# Patient Record
Sex: Female | Born: 1935 | Race: White | Hispanic: No | Smoking: Current every day smoker
Health system: Southern US, Community
[De-identification: ages and names within clinical notes are randomized; demographics above are authoritative.]

## PROBLEM LIST (undated history)

## (undated) DIAGNOSIS — J449 Chronic obstructive pulmonary disease, unspecified: Secondary | ICD-10-CM

## (undated) DIAGNOSIS — G629 Polyneuropathy, unspecified: Secondary | ICD-10-CM

## (undated) DIAGNOSIS — I10 Essential (primary) hypertension: Secondary | ICD-10-CM

## (undated) HISTORY — PX: OTHER SURGICAL HISTORY: SHX169

## (undated) HISTORY — PX: BACK SURGERY: SHX140

## (undated) HISTORY — PX: ABDOMINAL HYSTERECTOMY: SHX81

---

## 2014-08-04 ENCOUNTER — Emergency Department (HOSPITAL_COMMUNITY)
Admission: EM | Admit: 2014-08-04 | Discharge: 2014-08-04 | Disposition: A | Discharge status: Home / Self Care | Payer: Medicare (Managed Care) | Attending: Emergency Medicine | Admitting: Emergency Medicine

## 2014-08-04 ENCOUNTER — Emergency Department (HOSPITAL_COMMUNITY): Payer: Medicare (Managed Care)

## 2014-08-04 ENCOUNTER — Encounter (HOSPITAL_COMMUNITY): Payer: Self-pay | Admitting: Emergency Medicine

## 2014-08-04 DIAGNOSIS — Z8669 Personal history of other diseases of the nervous system and sense organs: Secondary | ICD-10-CM | POA: Insufficient documentation

## 2014-08-04 DIAGNOSIS — J449 Chronic obstructive pulmonary disease, unspecified: Secondary | ICD-10-CM | POA: Diagnosis not present

## 2014-08-04 DIAGNOSIS — R32 Unspecified urinary incontinence: Secondary | ICD-10-CM | POA: Diagnosis not present

## 2014-08-04 DIAGNOSIS — I10 Essential (primary) hypertension: Secondary | ICD-10-CM | POA: Insufficient documentation

## 2014-08-04 DIAGNOSIS — M79609 Pain in unspecified limb: Secondary | ICD-10-CM | POA: Diagnosis present

## 2014-08-04 DIAGNOSIS — J4489 Other specified chronic obstructive pulmonary disease: Secondary | ICD-10-CM | POA: Insufficient documentation

## 2014-08-04 DIAGNOSIS — R209 Unspecified disturbances of skin sensation: Secondary | ICD-10-CM | POA: Diagnosis not present

## 2014-08-04 DIAGNOSIS — F17200 Nicotine dependence, unspecified, uncomplicated: Secondary | ICD-10-CM | POA: Insufficient documentation

## 2014-08-04 DIAGNOSIS — R6883 Chills (without fever): Secondary | ICD-10-CM | POA: Insufficient documentation

## 2014-08-04 DIAGNOSIS — R079 Chest pain, unspecified: Secondary | ICD-10-CM | POA: Diagnosis not present

## 2014-08-04 DIAGNOSIS — R51 Headache: Secondary | ICD-10-CM | POA: Diagnosis not present

## 2014-08-04 DIAGNOSIS — Z79899 Other long term (current) drug therapy: Secondary | ICD-10-CM | POA: Insufficient documentation

## 2014-08-04 HISTORY — DX: Essential (primary) hypertension: I10

## 2014-08-04 HISTORY — DX: Chronic obstructive pulmonary disease, unspecified: J44.9

## 2014-08-04 HISTORY — DX: Polyneuropathy, unspecified: G62.9

## 2014-08-04 LAB — BASIC METABOLIC PANEL
Anion gap: 12 (ref 5–15)
BUN: 15 mg/dL (ref 6–23)
CALCIUM: 9.7 mg/dL (ref 8.4–10.5)
CHLORIDE: 102 meq/L (ref 96–112)
CO2: 25 meq/L (ref 19–32)
CREATININE: 1.29 mg/dL — AB (ref 0.50–1.10)
GFR calc non Af Amer: 39 mL/min — ABNORMAL LOW (ref >90)
GFR, EST AFRICAN AMERICAN: 46 mL/min — AB (ref >90)
Glucose, Bld: 101 mg/dL — ABNORMAL HIGH (ref 70–99)
Potassium: 5.1 mEq/L (ref 3.7–5.3)
SODIUM: 139 meq/L (ref 137–147)

## 2014-08-04 LAB — CBC WITH DIFFERENTIAL/PLATELET
Basophils Absolute: 0.1 10*3/uL (ref 0.0–0.1)
Basophils Relative: 1 % (ref 0–1)
EOS PCT: 2 % (ref 0–5)
Eosinophils Absolute: 0.2 10*3/uL (ref 0.0–0.7)
HCT: 41.8 % (ref 36.0–46.0)
Hemoglobin: 13.6 g/dL (ref 12.0–15.0)
LYMPHS PCT: 28 % (ref 12–46)
Lymphs Abs: 2.7 10*3/uL (ref 0.7–4.0)
MCH: 28.2 pg (ref 26.0–34.0)
MCHC: 32.5 g/dL (ref 30.0–36.0)
MCV: 86.5 fL (ref 78.0–100.0)
MONO ABS: 0.9 10*3/uL (ref 0.1–1.0)
Monocytes Relative: 9 % (ref 3–12)
NEUTROS ABS: 5.7 10*3/uL (ref 1.7–7.7)
Neutrophils Relative %: 60 % (ref 43–77)
PLATELETS: 243 10*3/uL (ref 150–400)
RBC: 4.83 MIL/uL (ref 3.87–5.11)
RDW: 13.7 % (ref 11.5–15.5)
WBC: 9.5 10*3/uL (ref 4.0–10.5)

## 2014-08-04 LAB — TROPONIN I: Troponin I: <0.3 ng/mL (ref <0.30)

## 2014-08-04 MED ORDER — GABAPENTIN 800 MG PO TABS: 800.0000 mg | ORAL_TABLET | Freq: Three times a day (TID) | ORAL | Status: DC

## 2014-08-04 NOTE — Discharge Instructions (Signed)
Resource guide provided to help you find a regular doctor. Your Neurontin has been ordered take as directed. Return for any newer worse symptoms. Today's workup without any significant findings. Lab results provided.    Emergency Department Resource Guide 1) Find a Doctor and Pay Out of Pocket Although you won't have to find out who is covered by your insurance plan, it is a good idea to ask around and get recommendations. You will then need to call the office and see if the doctor you have chosen will accept you as a new patient and what types of options they offer for patients who are self-pay. Some doctors offer discounts or will set up payment plans for their patients who do not have insurance, but you will need to ask so you aren't surprised when you get to your appointment.  2) Contact Your Local Health Department Not all health departments have doctors that can see patients for sick visits, but many do, so it is worth a call to see if yours does. If you don't know where your local health department is, you can check in your phone book. The CDC also has a tool to help you locate your state's health department, and many state websites also have listings of all of their local health departments.  3) Find a Walk-in Clinic If your illness is not likely to be very severe or complicated, you may want to try a walk in clinic. These are popping up all over the country in pharmacies, drugstores, and shopping centers. They're usually staffed by nurse practitioners or physician assistants that have been trained to treat common illnesses and complaints. They're usually fairly quick and inexpensive. However, if you have serious medical issues or chronic medical problems, these are probably not your best option.  No Primary Care Doctor: - Call Health Connect at  [phone] - they can help you locate a primary care doctor that  accepts your insurance, provides certain services, etc. - Physician Referral  Service- [phone]  Chronic Pain Problems: Organization         Address  Phone   Notes  [facility]  [phone] Patients need to be referred by their primary care doctor.   Medication Assistance: Organization         Address  Phone   Notes  [facility] Medication [facility] [address] [place] [place], [place] [address] [phone] --Must be a resident of [facility] -- Must have NO insurance coverage whatsoever (no Medicaid/ Medicare, etc.) -- The pt. MUST have a primary care doctor that directs their care regularly and follows them in the community   [facility]  [phone]   [employer]  [phone]    Agencies that provide inexpensive medical care: Organization         Address  Phone   Notes  [facility]  [phone]   [facility]    [phone]   [facility] [address] [place], [place] [address] [phone]   [facility] [address] [place] [place] [phone]   Planned Parenthood    [phone]   [facility]    [phone]   [facility]  [address], [place] Phone:  [phone], Fax:  [phone] Hours of Operation:  9 am - 6 pm, M-F.  Also accepts Medicaid/Medicare and self-pay.  [facility]  [facility]  [address], [place] Phone: [phone], Fax: [phone]. Hours of Operation:  8:30 am - 5:30 pm, M-F.  Also accepts Medicaid and self-pay.  [facility] [place] [address] [place] Phone: [phone]   George Mason, [place], [place] [phone], Ext. [phone] Mondays & Thursdays: 7-9 AM.  First 15 patients are seen on a first come, first serve basis.    [place] Providers:  Organization         Address  Phone   Notes  [facility] [address], [place] [phone] Also accepts self-pay patients.  [facility] [address], [place]  [phone]   [place], [address], [place] [phone]   [facility] [address], [place] [phone]   Lucianne Lei [address], [place]   [phone] Only accepts [place] Access [place] patients after they have their name applied to their card.   Self-Pay (no insurance) in [facility]:  Organization         Address  Phone   Notes  Sickle Cell Patients, [facility] [place] [phone]   [facility] [place] [phone]   [facility] [place]  [place], [place],  [phone]   [facility]/Dr. Osei-Bonsu  [address], [place] or [address], [place] [phone] Phone number for both [place] and [place] locations is the same.  [facility]gent [facility] and [facility] [address], [place] [phone]   [facility] [address] [place] or [address] [phone]   [facility] [address], [place] [phone], phone; [phone], fax Sees patients 1st and 3rd Saturday of every month.  Must not qualify for public or private insurance (i.e. Medicaid, Medicare, [place] Health Choice, Veterans' Benefits)  Household income should be no more than 200% of the poverty level The clinic cannot treat you if you are pregnant or think you are pregnant  Sexually transmitted diseases are not treated at the clinic.    Dental Care: Organization         Address  Phone  Notes  [facility] Department of [facility] [phone] Accepts children up to age 4 who are enrolled in  [place] or [place]; pregnant women with a Medicaid card; and children who have applied for Medicaid or [place] Health Choice, but were declined, whose parents can pay a reduced fee at time of service.  [facility] of [facility]  [address], [place] [phone] Accepts children up to age 46 who are enrolled in [place] or [place]; pregnant women with a Medicaid card; and children who have applied for Medicaid or [place] Health Choice, but were declined, whose parents can pay a reduced fee at time of service.  [place] Adult [facility] Access PROGRAM  [place] [phone] Patients are seen by appointment only. Walk-ins are not accepted. [facility] will see patients 39 years of age and older. Monday - Tuesday (8am-5pm) Most Wednesdays (8:30-5pm) $30 per visit, cash only  [facility]  [address], [facility] [phone] Patients are seen by appointment only. Walk-ins are not accepted. [place] will see patients 55 years of age and older. One Wednesday Evening (Monthly: Volunteer Based).  $30 per visit, cash only  [place]  [phone] for adults; Children under age 16, call Graduate Pediatric Dentistry at [phone]. Children aged 15-14, please call [phone] to request a pediatric application.  Dental services are provided in all areas of dental care including fillings, crowns and bridges, complete and partial dentures, implants, gum treatment, root canals, and extractions. Preventive care is also provided. Treatment is provided to both adults and children. Patients are selected via a lottery and there is often a waiting list.   [facility] [address] [place]  [phone] [email or web address]   [facility] [address] [place], [place] [phone], Ext. [phone] Second and Fourth Thursday of each month, opens at 6:30  AM; Clinic ends at 9 AM.  Patients are seen on a first-come first-served basis, and a limited number are seen during each clinic.   [facility]  [address] Hillard Danker [place], [place] [phone]   Eligibility Requirements You must have lived in [place], [place], or [place] counties for at least the last three months.   You cannot be eligible for state or federal sponsored [employer], including [employer], [place], or [employer].   You generally cannot be eligible for healthcare insurance through your employer.    How to apply: Eligibility screenings are held every Tuesday and Wednesday afternoon from 1:00 pm until 4:00 pm. You do not need an appointment for the interview!  [facility] [address] [place], [place]   [employer]  [place] Department  [place]  [phone]    Behavioral Health Resources in the Community: Intensive Outpatient Programs Organization         Address  Phone  Notes  [place]. [address], [place], [place] [phone]   [facility] [address], [place], [employer]   [facility]: [facility] [address], [place], [place]   [place] [address]  [place], [place] or [phone]   Substance Abuse Resources Organization         Address  Phone  Notes  Alcohol and Drug Services  [phone]   [place]  [phone]   [facility] [place]   Chinita Pester  [phone]   Residential & Outpatient Substance Abuse Program  [phone]   Psychological Services Organization         Address  Phone  Notes  [facility] [place]  [place]  [phone]   [place] [address] [place] [phone] or  [phone]    Mobile Crisis Teams Organization         Address  Phone  Notes  [facility], Mobile Crisis Care Unit  [phone]   [facility]  [address] [place], [place]   [place] [address] [place] [phone]    Self-Help/Support Groups Organization         Address  Phone             Notes  [facility] of [place] - variety of support groups  [place] Call for more information  Narcotics Anonymous (NA), [facility] [address], [employer] [place]  2 meetings at this location   Special educational needs teacher         Address  Phone  Notes  [facility] [place],    [place]  [phone]   [facility]  [address], [place] [address], [place], [place]   [place], [place] [phone] Admissions: 8am-3pm M-F  Incentives Substance [place] [address],    [place], [place] [phone]   [facility] [address] [place], [place], [place]   [facility] [address],  [place], [place]   [facility] - Intensive Outpatient [address], [place], [place]   [facility] ([place].,  [place], [place] [phone] or [phone]   [facility] ([facility]) [address] [place], [place] Accepts Medicaid  [facility] [place] [address]  [place] [phone] Substance Abuse/Addiction Treatment   [facility] Organization         Address  Phone  Notes  [facility]  [phone]   Domenic Schwab, PhD [address] Arlis Porta [place], [place]   [phone] or [phone]   [place], [place] [phone]   [facility] [address],  [place], [place] [phone] Insurance/Medicaid/sponsorship through [facility] and Families [address], [address]                                    [place], [place] [phone] [place] [address], [place] [phone]    Dr. Adele Schilder  [phone]   [facility] Dept. 1) [address] [place] 2) [place] 3)  [place] [address], [place] [phone]  [phone]   [place] [phone] or [phone] (After Hours)

## 2014-08-04 NOTE — ED Notes (Addendum)
Ran out of medicine and is c/o neuropathy pain to bil legs.  Pt also c/o "constant cp", states has been seen by doctors in [facility] but never told what is was.

## 2014-08-04 NOTE — ED Provider Notes (Signed)
CSN: [id number]     Arrival date & time 08/04/14  1540 History  This chart was scribed for Vanetta Mulders, MD by Milly Jakob, ED Scribe. The patient was seen in room APA19/APA19. Patient's care was started at 8:11 PM.   Chief Complaint  Patient presents with  . Leg Pain   The history is provided by the patient. No language interpreter was used.   HPI Comments: Linda Mcclure is a 78 y.o. female who presents to the Emergency Department complaining of bilateral substernal, "squeezing," central chest pain every day for at least the past week. She rates this pain as an 8/10. She reports associated SOB that radiates into her back onset 3 weeks ago. Her relative reports that she recently had open heart surgery a few months ago when they were living in [place]. She also reports pain in her legs bilaterally that she describes as her legs feeling like "cold wax" at night. She reports a morphine patch attached to her back that has not been moved for 6 years. She reports a history of neuropathy in her legs for which she takes 800 MG Neurontin 3 times per day with some relief. She reports difficulty walking without assistance.   Past Medical History  Diagnosis Date  . Neuropathy   . Hypertension   . COPD (chronic obstructive pulmonary disease)    Past Surgical History  Procedure Laterality Date  . Back surgery    . Carpel tunnel    . Abdominal hysterectomy     No family history on file. History  Substance Use Topics  . Smoking status: Current Every Day Smoker    Types: Cigarettes  . Smokeless tobacco: Not on file  . Alcohol Use: No   OB History   Grav Para Term Preterm Abortions TAB SAB Ect Mult Living                 Review of Systems  Constitutional: Positive for chills. Negative for fever.  HENT: Negative for rhinorrhea and sore throat.   Eyes: Negative for visual disturbance.  Respiratory: Positive for cough and shortness of breath.   Cardiovascular: Positive for chest pain.  Negative for leg swelling.  Gastrointestinal: Negative for nausea, vomiting, abdominal pain and diarrhea.  Genitourinary: Negative for dysuria.       Some baseline urinary incontinence   Musculoskeletal: Positive for back pain. Negative for neck pain.  Skin: Negative for rash.  Neurological: Positive for numbness and headaches. Negative for dizziness and light-headedness.  Hematological: Does not bruise/bleed easily.  Psychiatric/Behavioral: Negative for confusion.    Allergies  Review of patient's allergies indicates no known allergies.  Home Medications   Prior to Admission medications   Medication Sig Start Date End Date Taking? Authorizing Provider  albuterol (PROVENTIL HFA;VENTOLIN HFA) 108 (90 BASE) MCG/ACT inhaler Inhale 2 puffs into the lungs every 6 (six) hours as needed for wheezing or shortness of breath.   Yes Historical Provider, MD  amLODipine (NORVASC) 5 MG tablet Take 5 mg by mouth daily.   Yes Historical Provider, MD  cholecalciferol (VITAMIN D) 1000 UNITS tablet Take 1,000 Units by mouth daily.   Yes Historical Provider, MD  ferrous sulfate 325 (65 FE) MG tablet Take 325 mg by mouth daily with breakfast.   Yes Historical Provider, MD  folic acid (FOLVITE) 1 MG tablet Take 1 mg by mouth daily.   Yes Historical Provider, MD  gabapentin (NEURONTIN) 800 MG tablet Take 800 mg by mouth 3 (three) times daily.  Yes Historical Provider, MD  HYDROcodone-acetaminophen (NORCO/VICODIN) 5-325 MG per tablet Take 1 tablet by mouth every 6 (six) hours as needed for moderate pain.   Yes Historical Provider, MD  losartan (COZAAR) 100 MG tablet Take 100 mg by mouth daily.   Yes Historical Provider, MD  OVER THE COUNTER MEDICATION Take 1 tablet by mouth daily. PROSIGHT Vitamin Mineral and Supplement   Yes Historical Provider, MD  simvastatin (ZOCOR) 40 MG tablet Take 40 mg by mouth every evening.   Yes Historical Provider, MD  vitamin B-12 (CYANOCOBALAMIN) 100 MCG tablet Take 100 mcg by  mouth daily.   Yes Historical Provider, MD  vitamin C (ASCORBIC ACID) 500 MG tablet Take 500 mg by mouth daily.   Yes Historical Provider, MD  gabapentin (NEURONTIN) 800 MG tablet Take 1 tablet (800 mg total) by mouth 3 (three) times daily. 08/04/14   Vanetta Mulders, MD   Triage Vitals: BP 148/62  Pulse 81  Temp(Src) 97.7 F (36.5 C) (Oral)  Resp 18  Ht  (1.6 m)  Wt 150 lb (68.04 kg)  BMI 26.58 kg/m2  SpO2 99% Physical Exam  Cardiovascular: Normal rate, regular rhythm and normal heart sounds.   No murmur heard. Pulmonary/Chest: Effort normal and breath sounds normal. No respiratory distress. She has no wheezes. She has no rales.  Abdominal: Soft. Bowel sounds are normal. There is no tenderness.  Musculoskeletal: She exhibits no edema.  Capillary refill < 2 seconds in both big toes.    ED Course  Procedures (including critical care time) DIAGNOSTIC STUDIES: Oxygen Saturation is 99% on room air, normal by my interpretation.    COORDINATION OF CARE: 8:23 PM-Discussed treatment plan which includes lab work and CXR with pt at bedside and pt agreed to plan.   Labs Review Labs Reviewed  BASIC METABOLIC PANEL - Abnormal; Notable for the following:    Glucose, Bld 101 (*)    Creatinine, Ser 1.29 (*)    GFR calc non Af Amer 39 (*)    GFR calc Af Amer 46 (*)    All other components within normal limits  CBC WITH DIFFERENTIAL  TROPONIN I    Imaging Review Dg Chest 2 View  08/04/2014   CLINICAL DATA:  Chronic chest pain and dizziness with history of hypertension and COPD  EXAM: CHEST  2 VIEW  COMPARISON:  None.  FINDINGS: The lungs are well-expanded and clear. The heart and mediastinal structures are within the limits of normal. There is mild tortuosity of the descending thoracic aorta. There is gentle curvature of the mid thoracic spine with convexity toward the left. There is no pleural effusion or pneumothorax. There is partial compression of the body of T12 with loss of height  anteriorly of approximately 20%.  IMPRESSION: There is no evidence of pneumonia nor CHF. Mild hyperinflation is consistent with COPD.   Electronically Signed   By: David  [place]   On: 08/04/2014 17:12     EKG Interpretation   Date/Time:  Wednesday August 04 2014 15:53:42 EDT Ventricular Rate:  80 PR Interval:  172 QRS Duration: 70 QT Interval:  404 QTC Calculation: 465 R Axis:   34 Text Interpretation:  Sinus rhythm with occasional Premature ventricular  complexes Otherwise normal ECG No previous ECGs available Confirmed by  Gerlene Glassburn  MD, Jamar Casagrande ([id number]) on 08/04/2014 7:51:29 PM      MDM   Final diagnoses:  Chest pain, unspecified chest pain type   Patient with a chronic chest pain been present for  several weeks. Workup here today EKG without acute changes. Chest x-ray negative for any acute changes. Patient's troponin was negative.  In addition patient has long-standing neuropathy of both legs. Almost out of her Neurontin that was renewed.   I personally performed the services described in this documentation, which was scribed in my presence. The recorded information has been reviewed and is accurate.     Vanetta Mulders, MD 08/05/14 [id number]

## 2014-12-11 ENCOUNTER — Encounter (HOSPITAL_COMMUNITY): Payer: Self-pay | Admitting: *Deleted

## 2014-12-11 ENCOUNTER — Emergency Department (HOSPITAL_COMMUNITY)
Admission: EM | Admit: 2014-12-11 | Discharge: 2014-12-11 | Disposition: A | Discharge status: Home / Self Care | Payer: Medicare (Managed Care) | Attending: Emergency Medicine | Admitting: Emergency Medicine

## 2014-12-11 DIAGNOSIS — Z720 Tobacco use: Secondary | ICD-10-CM | POA: Diagnosis not present

## 2014-12-11 DIAGNOSIS — I10 Essential (primary) hypertension: Secondary | ICD-10-CM | POA: Insufficient documentation

## 2014-12-11 DIAGNOSIS — G629 Polyneuropathy, unspecified: Secondary | ICD-10-CM | POA: Diagnosis not present

## 2014-12-11 DIAGNOSIS — R112 Nausea with vomiting, unspecified: Secondary | ICD-10-CM | POA: Insufficient documentation

## 2014-12-11 DIAGNOSIS — L03211 Cellulitis of face: Secondary | ICD-10-CM | POA: Diagnosis not present

## 2014-12-11 DIAGNOSIS — J449 Chronic obstructive pulmonary disease, unspecified: Secondary | ICD-10-CM | POA: Insufficient documentation

## 2014-12-11 DIAGNOSIS — L309 Dermatitis, unspecified: Secondary | ICD-10-CM | POA: Insufficient documentation

## 2014-12-11 DIAGNOSIS — Z79899 Other long term (current) drug therapy: Secondary | ICD-10-CM | POA: Diagnosis not present

## 2014-12-11 DIAGNOSIS — R21 Rash and other nonspecific skin eruption: Secondary | ICD-10-CM | POA: Diagnosis present

## 2014-12-11 MED ORDER — PREDNISONE 20 MG PO TABS: ORAL_TABLET | ORAL | Status: AC

## 2014-12-11 MED ORDER — HYDROXYZINE HCL 25 MG PO TABS: 25.0000 mg | ORAL_TABLET | Freq: Four times a day (QID) | ORAL | Status: AC

## 2014-12-11 MED ORDER — SULFAMETHOXAZOLE-TRIMETHOPRIM 800-160 MG PO TABS
1.0000 | ORAL_TABLET | Freq: Two times a day (BID) | ORAL | Status: AC
Start: 2014-12-11 — End: ?

## 2014-12-11 MED ORDER — GABAPENTIN 800 MG PO TABS: 800.0000 mg | ORAL_TABLET | Freq: Three times a day (TID) | ORAL | Status: AC

## 2014-12-11 NOTE — ED Notes (Signed)
Pt brought in by [facility] for c/o spider bites to right cheek and nape of neck; clear drainage is coming from right cheek and red raised area to nape of neck

## 2014-12-11 NOTE — ED Provider Notes (Addendum)
CSN: [id number]     Arrival date & time 12/11/14  1913 History   First MD Initiated Contact with Patient 12/11/14 1916     Chief Complaint  Patient presents with  . Insect Bite     (Consider location/radiation/quality/duration/timing/severity/associated sxs/prior Treatment) HPI Comments: Patient presents to the ER by EMS because she thinks she was bitten by a brown recluse spider. Patient reports that there is a painful raised red spot on the back of her neck and also on her right cheek. Area is mildly to moderately painful, constant. No exacerbating or alleviating factors. She reports a low-grade fever earlier and had nausea and vomiting.   Past Medical History  Diagnosis Date  . Neuropathy   . Hypertension   . COPD (chronic obstructive pulmonary disease)    Past Surgical History  Procedure Laterality Date  . Back surgery    . Carpel tunnel    . Abdominal hysterectomy     History reviewed. No pertinent family history. History  Substance Use Topics  . Smoking status: Current Every Day Smoker    Types: Cigarettes  . Smokeless tobacco: Not on file  . Alcohol Use: No   OB History    No data available     Review of Systems  Skin: Positive for rash.  All other systems reviewed and are negative.     Allergies  Review of patient's allergies indicates no known allergies.  Home Medications   Prior to Admission medications   Medication Sig Start Date End Date Taking? Authorizing Provider  albuterol (PROVENTIL HFA;VENTOLIN HFA) 108 (90 BASE) MCG/ACT inhaler Inhale 2 puffs into the lungs every 6 (six) hours as needed for wheezing or shortness of breath.    Historical Provider, MD  amLODipine (NORVASC) 5 MG tablet Take 5 mg by mouth daily.    Historical Provider, MD  cholecalciferol (VITAMIN D) 1000 UNITS tablet Take 1,000 Units by mouth daily.    Historical Provider, MD  ferrous sulfate 325 (65 FE) MG tablet Take 325 mg by mouth daily with breakfast.    Historical Provider,  MD  folic acid (FOLVITE) 1 MG tablet Take 1 mg by mouth daily.    Historical Provider, MD  gabapentin (NEURONTIN) 800 MG tablet Take 800 mg by mouth 3 (three) times daily.    Historical Provider, MD  gabapentin (NEURONTIN) 800 MG tablet Take 1 tablet (800 mg total) by mouth 3 (three) times daily. 08/04/14   Vanetta Mulders, MD  HYDROcodone-acetaminophen (NORCO/VICODIN) 5-325 MG per tablet Take 1 tablet by mouth every 6 (six) hours as needed for moderate pain.    Historical Provider, MD  losartan (COZAAR) 100 MG tablet Take 100 mg by mouth daily.    Historical Provider, MD  OVER THE COUNTER MEDICATION Take 1 tablet by mouth daily. PROSIGHT Vitamin Mineral and Supplement    Historical Provider, MD  simvastatin (ZOCOR) 40 MG tablet Take 40 mg by mouth every evening.    Historical Provider, MD  vitamin B-12 (CYANOCOBALAMIN) 100 MCG tablet Take 100 mcg by mouth daily.    Historical Provider, MD  vitamin C (ASCORBIC ACID) 500 MG tablet Take 500 mg by mouth daily.    Historical Provider, MD   There were no vitals taken for this visit. Physical Exam  Constitutional: She is oriented to person, place, and time. She appears well-developed and well-nourished. No distress.  HENT:  Head: Normocephalic and atraumatic.  Right Ear: Hearing normal.  Left Ear: Hearing normal.  Nose: Nose normal.  Mouth/Throat: Oropharynx  is clear and moist and mucous membranes are normal.  Eyes: Conjunctivae and EOM are normal. Pupils are equal, round, and reactive to light.  Neck: Normal range of motion. Neck supple.  Cardiovascular: Regular rhythm, S1 normal and S2 normal.  Exam reveals no gallop and no friction rub.   No murmur heard. Pulmonary/Chest: Effort normal and breath sounds normal. No respiratory distress. She exhibits no tenderness.  Abdominal: Soft. Normal appearance and bowel sounds are normal. There is no hepatosplenomegaly. There is no tenderness. There is no rebound, no guarding, no tenderness at McBurney's  point and negative Murphy's sign. No hernia.  Musculoskeletal: Normal range of motion.  Neurological: She is alert and oriented to person, place, and time. She has normal strength. No cranial nerve deficit or sensory deficit. Coordination normal. GCS eye subscore is 4. GCS verbal subscore is 5. GCS motor subscore is 6.  Skin: Skin is warm, dry and intact. Rash noted. No cyanosis.  Diffuse raised erythematous lesions with excoriations and scabbing  Circular erythematous slightly raised lesion right zygoma region without any induration or fluctuance  Raised erythematous nodule posterior neck with overlying scab, no fluctuance or induration  Psychiatric: She has a normal mood and affect. Her speech is normal and behavior is normal. Thought content normal.  Nursing note and vitals reviewed.   ED Course  Procedures (including critical care time) Labs Review Labs Reviewed - No data to display  Imaging Review No results found.   EKG Interpretation None      MDM   Final diagnoses:  None  cellulitis dermatitis  She has diffuse rash all over her extremities and torso that she reports has been there for 2 years. She tells me she [place] dermat[place] in [place] who could not identify what caused the rash. I believe that the lesion on her face and her neck are related. The area on the neck is associated with a cluster of scabs similar to her long standing rash. I suspect that the patient scratches and picks at the regions and has now developed a superinfection. She will be treated with Bactrim. Vistaril for itching. Low-dose prednisone for the rash.   Gilda Creasehristopher J. Axavier Pressley, MD 12/11/14 1951  Gilda Creasehristopher J. Clevon Khader, MD 12/11/14 2026

## 2014-12-11 NOTE — Discharge Instructions (Signed)

## 2015-02-15 IMAGING — CR DG CHEST 2V
2 series · 2 of 2 positions shown · non-contrast
Comparison: None.

CLINICAL DATA: Chronic chest pain and dizziness with history of
hypertension and COPD

EXAM:
CHEST  2 VIEW

[view not recorded (1 of 2)]
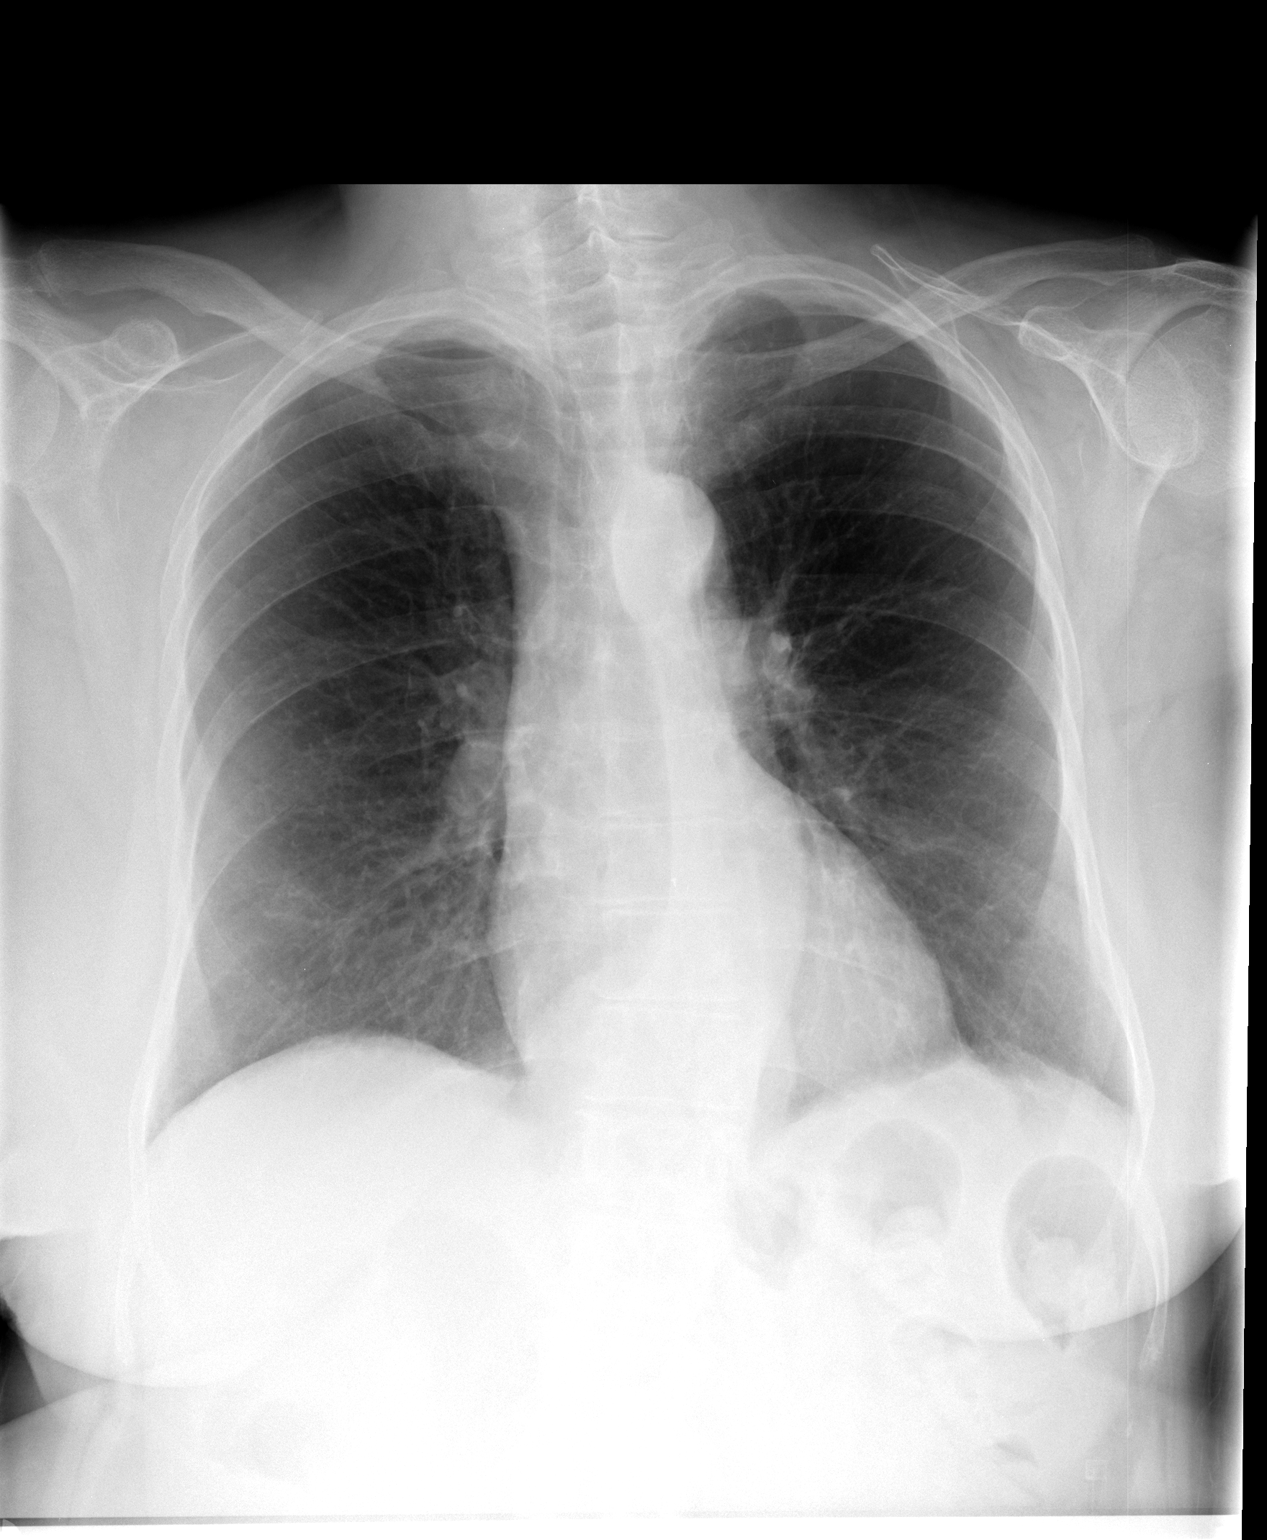

[view not recorded (2 of 2)]
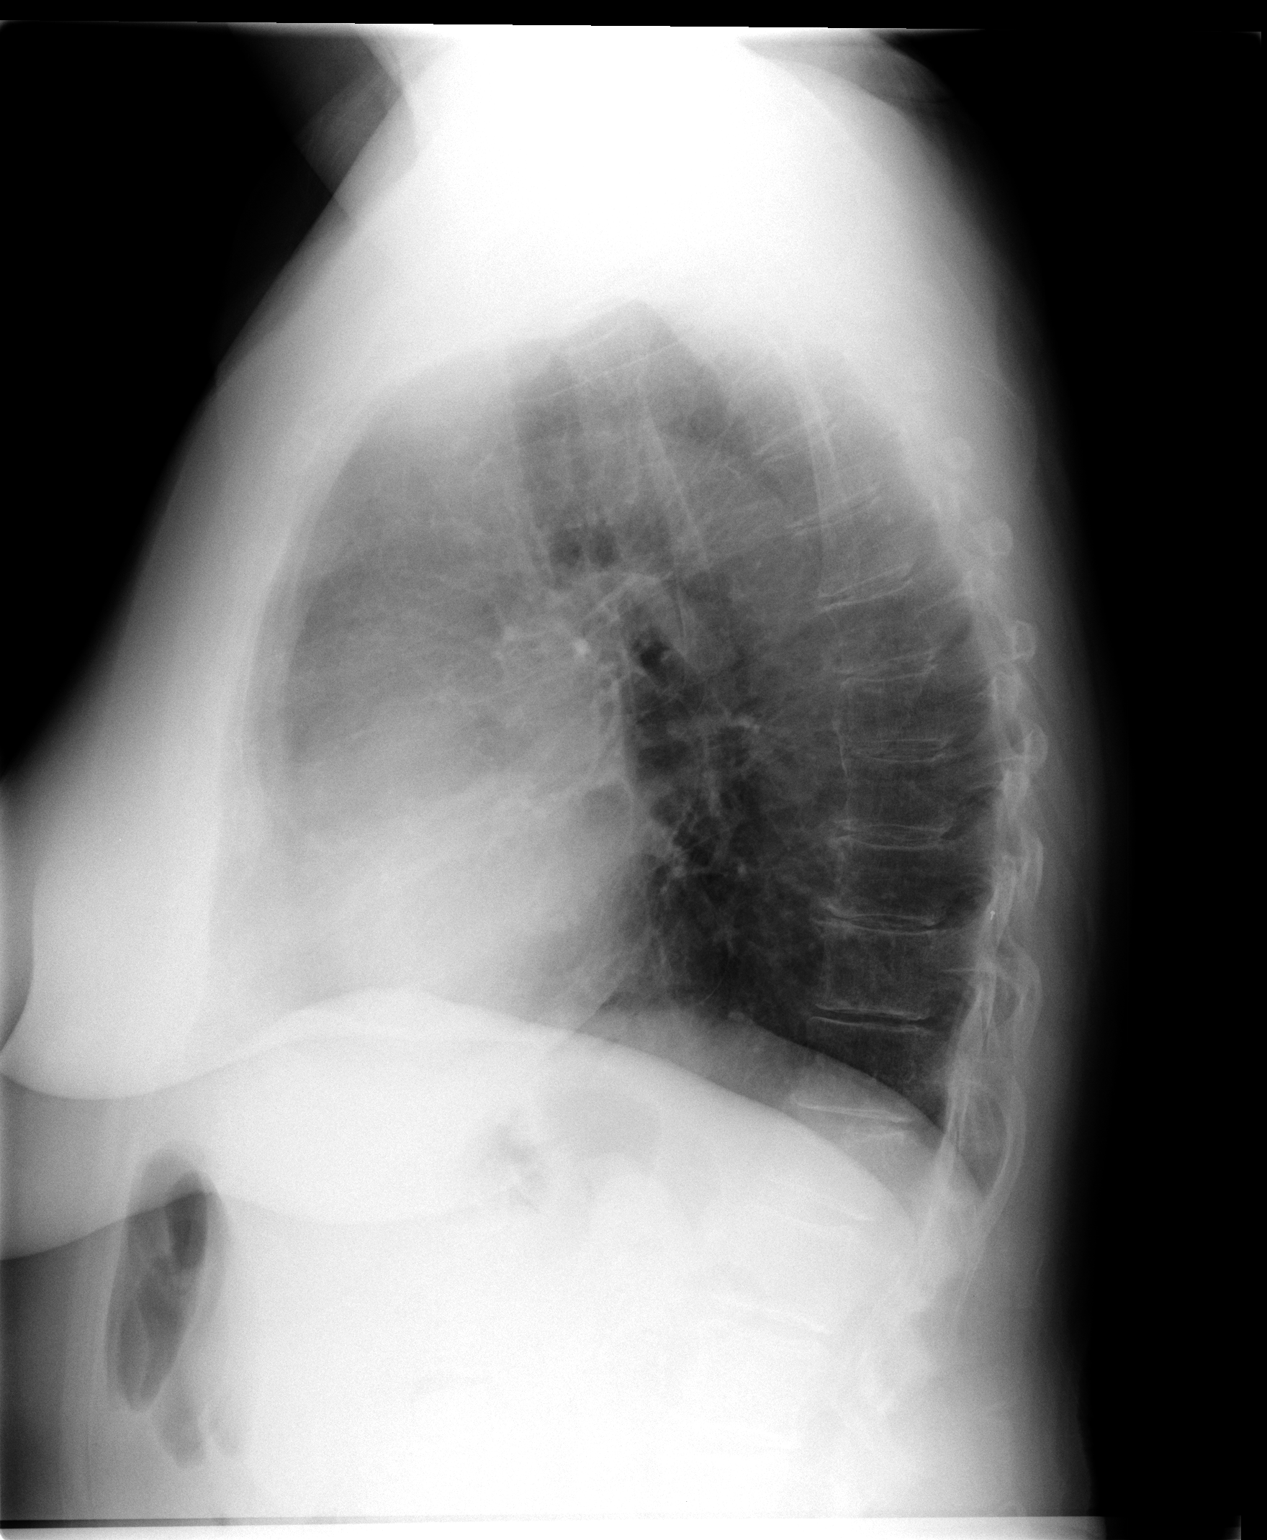

[2 of 2 positions shown; findings below may reference images not displayed]

FINDINGS: The lungs are well-expanded and clear. The heart and mediastinal
structures are within the limits of normal. There is mild tortuosity
of the descending thoracic aorta. There is gentle curvature of the
mid thoracic spine with convexity toward the left. There is no
pleural effusion or pneumothorax. There is partial compression of
the body of T12 with loss of height anteriorly of approximately 20%.
IMPRESSION: There is no evidence of pneumonia nor CHF. Mild hyperinflation is
consistent with COPD.
